# Patient Record
Sex: Female | Born: 1975 | Race: White | Hispanic: No | Marital: Married | Smoking: Never smoker
Health system: Southern US, Community
[De-identification: ages and names within clinical notes are randomized; demographics above are authoritative.]

## PROBLEM LIST (undated history)

## (undated) DIAGNOSIS — Z789 Other specified health status: Secondary | ICD-10-CM

## (undated) HISTORY — PX: WISDOM TOOTH EXTRACTION: SHX21

## (undated) HISTORY — DX: Other specified health status: Z78.9

---

## 2005-11-05 ENCOUNTER — Encounter: Admission: RE | Admit: 2005-11-05 | Discharge: 2005-11-05 | Payer: Self-pay | Admitting: Family Medicine

## 2005-12-12 ENCOUNTER — Ambulatory Visit: Payer: Self-pay | Admitting: Obstetrics & Gynecology

## 2005-12-15 ENCOUNTER — Ambulatory Visit: Payer: Self-pay | Admitting: Obstetrics & Gynecology

## 2006-07-28 ENCOUNTER — Observation Stay: Payer: Self-pay | Admitting: Unknown Physician Specialty

## 2006-08-03 ENCOUNTER — Inpatient Hospital Stay: Payer: Self-pay | Admitting: Obstetrics & Gynecology

## 2007-08-29 ENCOUNTER — Emergency Department (HOSPITAL_COMMUNITY): Admission: EM | Admit: 2007-08-29 | Discharge: 2007-08-29 | Payer: Self-pay | Admitting: Emergency Medicine

## 2008-04-17 ENCOUNTER — Ambulatory Visit: Payer: Self-pay | Admitting: Family Medicine

## 2009-05-12 ENCOUNTER — Emergency Department: Payer: Self-pay | Admitting: Emergency Medicine

## 2009-06-06 ENCOUNTER — Ambulatory Visit: Payer: Self-pay | Admitting: Obstetrics and Gynecology

## 2010-11-17 ENCOUNTER — Encounter: Payer: Self-pay | Admitting: Family Medicine

## 2014-12-29 LAB — HM PAP SMEAR: HM Pap smear: NORMAL

## 2018-08-09 ENCOUNTER — Encounter: Payer: Self-pay | Admitting: Obstetrics and Gynecology

## 2018-08-09 ENCOUNTER — Other Ambulatory Visit (HOSPITAL_COMMUNITY)
Admission: RE | Admit: 2018-08-09 | Discharge: 2018-08-09 | Disposition: A | Discharge status: Home / Self Care | Payer: BLUE CROSS/BLUE SHIELD | Source: Ambulatory Visit | Attending: Obstetrics and Gynecology | Admitting: Obstetrics and Gynecology

## 2018-08-09 ENCOUNTER — Ambulatory Visit (INDEPENDENT_AMBULATORY_CARE_PROVIDER_SITE_OTHER): Payer: BLUE CROSS/BLUE SHIELD | Admitting: Obstetrics and Gynecology

## 2018-08-09 VITALS — BP 118/78 | Ht 62.0 in | Wt 132.0 lb

## 2018-08-09 DIAGNOSIS — Z124 Encounter for screening for malignant neoplasm of cervix: Secondary | ICD-10-CM | POA: Insufficient documentation

## 2018-08-09 DIAGNOSIS — Z1339 Encounter for screening examination for other mental health and behavioral disorders: Secondary | ICD-10-CM

## 2018-08-09 DIAGNOSIS — Z01419 Encounter for gynecological examination (general) (routine) without abnormal findings: Secondary | ICD-10-CM

## 2018-08-09 DIAGNOSIS — Z1331 Encounter for screening for depression: Secondary | ICD-10-CM | POA: Diagnosis not present

## 2018-08-09 DIAGNOSIS — Z1151 Encounter for screening for human papillomavirus (HPV): Secondary | ICD-10-CM | POA: Diagnosis not present

## 2018-08-09 NOTE — Progress Notes (Signed)
Gynecology Annual Exam  PCP: Patient, No Pcp Per  Chief Complaint  Patient presents with  . Annual Exam   History of Present Illness:  Ms. KEYLA MILONE is a 42 y.o. [phone] who LMP was Patient's last menstrual period was 08/02/2018., presents today for her annual examination.  Her menses are regular every 28-30 days, lasting 6 day(s).  Dysmenorrhea mild, occurring first 1-2 days of flow. She does not have intermenstrual bleeding.  She is single partner, contraception - vasectomy.   Last Pap: 12/2014  Results were: no abnormalities / HPV DNA negative.  Hx of STDs: chlamydia - 20 years ago, treated.   Last mammogram: never had  There is no FH of breast cancer. There is no FH of ovarian cancer. The patient does do self-breast exams.  Colonoscopy: n/a DEXA: has not been screened for osteoporosis  Tobacco use: The patient denies current or previous tobacco use. Alcohol use: none Exercise: moderately active  The patient wears seatbelts: yes.     History reviewed. No pertinent past medical history.  Past Surgical History:  Procedure Laterality Date  . WISDOM TOOTH EXTRACTION      Prior to Admission medications   denies   Allergies:No Known Allergies  Obstetric History: [id number], SVD x 4  Family History  Problem Relation Age of Onset  . Hypertension Mother   . Diabetes Mellitus II Mother   . Hypertension Father   . Heart disease Father   . Diabetes Mellitus II Father   . Colon cancer Father     Social History   Socioeconomic History  . Marital status: Married    Spouse name: Not on file  . Number of children: Not on file  . Years of education: Not on file  . Highest education level: Not on file  Occupational History  . Not on file  Social Needs  . Financial resource strain: Not on file  . Food insecurity:    Worry: Not on file    Inability: Not on file  . Transportation needs:    Medical: Not on file    Non-medical: Not on file  Tobacco Use  . Smoking  status: Never Smoker  . Smokeless tobacco: Never Used  Substance and Sexual Activity  . Alcohol use: Never    Frequency: Never  . Drug use: Never  . Sexual activity: Yes    Birth control/protection: Surgical  Lifestyle  . Physical activity:    Days per week: Not on file    Minutes per session: Not on file  . Stress: Not on file  Relationships  . Social connections:    Talks on phone: Not on file    Gets together: Not on file    Attends religious service: Not on file    Active member of club or organization: Not on file    Attends meetings of clubs or organizations: Not on file    Relationship status: Not on file  . Intimate partner violence:    Fear of current or ex partner: Not on file    Emotionally abused: Not on file    Physically abused: Not on file    Forced sexual activity: Not on file  Other Topics Concern  . Not on file  Social History Narrative  . Not on file    Review of Systems  Constitutional: Negative.   HENT: Negative.   Eyes: Negative.   Respiratory: Negative.   Cardiovascular: Negative.   Gastrointestinal: Negative.   Genitourinary: Negative.   Musculoskeletal:  Negative.   Skin: Negative.   Neurological: Negative.   Psychiatric/Behavioral: Negative.      Physical Exam BP 118/78   Ht 5\' 2"  (1.575 m)   Wt 132 lb (59.9 kg)   LMP 08/02/2018   BMI 24.14 kg/m   Physical Exam  Constitutional: She is oriented to person, place, and time. She appears well-developed and well-nourished. No distress.  Genitourinary: Uterus normal. Pelvic exam was performed with patient supine. There is no rash, tenderness, lesion or injury on the right labia. There is no rash, tenderness, lesion or injury on the left labia. No erythema, tenderness or bleeding in the vagina. No signs of injury around the vagina. No vaginal discharge found. Right adnexum does not display mass, does not display tenderness and does not display fullness. Left adnexum does not display mass, does  not display tenderness and does not display fullness. Cervix does not exhibit motion tenderness, lesion, discharge or polyp.   Uterus is mobile and anteverted. Uterus is not enlarged, tender or exhibiting a mass.  HENT:  Head: Normocephalic and atraumatic.  Eyes: EOM are normal. No scleral icterus.  Neck: Normal range of motion. Neck supple. No thyromegaly present.  Cardiovascular: Normal rate and regular rhythm. Exam reveals no gallop and no friction rub.  No murmur heard. Pulmonary/Chest: Effort normal and breath sounds normal. No respiratory distress. She has no wheezes. She has no rales. Right breast exhibits no inverted nipple, no mass, no nipple discharge, no skin change and no tenderness. Left breast exhibits no inverted nipple, no mass, no nipple discharge, no skin change and no tenderness.  Abdominal: Soft. Bowel sounds are normal. She exhibits no distension and no mass. There is no tenderness. There is no rebound and no guarding.  Musculoskeletal: Normal range of motion. She exhibits no edema or tenderness.  Lymphadenopathy:    She has no cervical adenopathy.       Right: No inguinal adenopathy present.       Left: No inguinal adenopathy present.  Neurological: She is alert and oriented to person, place, and time. No cranial nerve deficit.  Skin: Skin is warm and dry. No rash noted. No erythema.  Psychiatric: She has a normal mood and affect. Her behavior is normal. Judgment normal.    Female chaperone present for pelvic and breast  portions of the physical exam  Results: AUDIT Questionnaire (screen for alcoholism): 0 PHQ-9: 2  Assessment: 42 y.o. [id number] female here for routine gynecologic examination.  Plan: Problem List Items Addressed This Visit    None    Visit Diagnoses    Women's annual routine gynecological examination    -  Primary   Relevant Orders   Cytology - PAP   Screening for depression       Screening for alcoholism       Pap smear for cervical cancer  screening       Relevant Orders   Cytology - PAP     Screening: -- Blood pressure screen normal -- Colonoscopy - not due -- Mammogram - due. Patient to call [facility] to arrange. She understands that it is her responsibility to arrange this. -- Weight screening: normal -- Depression screening negative (PHQ-9) -- Nutrition: normal -- cholesterol screening: not due for screening -- osteoporosis screening: not due -- tobacco screening: not using -- alcohol screening: AUDIT questionnaire indicates low-risk usage. -- family history of breast cancer screening: done. not at high risk. -- no evidence of domestic violence or intimate partner violence. -- STD screening:  gonorrhea/chlamydia NAAT not collected per patient request. -- pap smear collected per ASCCP guidelines -- flu vaccine declines -- HPV vaccination series: did not receive  Thomasene Mohair, MD 08/09/2018 12:03 PM

## 2018-08-11 LAB — CYTOLOGY - PAP
DIAGNOSIS: NEGATIVE
HPV: NOT DETECTED

## 2020-09-06 ENCOUNTER — Ambulatory Visit (INDEPENDENT_AMBULATORY_CARE_PROVIDER_SITE_OTHER): Payer: 59 | Admitting: Obstetrics and Gynecology

## 2020-09-06 ENCOUNTER — Encounter: Payer: Self-pay | Admitting: Obstetrics and Gynecology

## 2020-09-06 ENCOUNTER — Other Ambulatory Visit: Payer: Self-pay

## 2020-09-06 VITALS — BP 122/74 | Ht 64.0 in | Wt 132.0 lb

## 2020-09-06 DIAGNOSIS — Z1231 Encounter for screening mammogram for malignant neoplasm of breast: Secondary | ICD-10-CM | POA: Diagnosis not present

## 2020-09-06 DIAGNOSIS — Z01419 Encounter for gynecological examination (general) (routine) without abnormal findings: Secondary | ICD-10-CM | POA: Diagnosis not present

## 2020-09-06 DIAGNOSIS — Z1331 Encounter for screening for depression: Secondary | ICD-10-CM

## 2020-09-06 DIAGNOSIS — Z1339 Encounter for screening examination for other mental health and behavioral disorders: Secondary | ICD-10-CM | POA: Diagnosis not present

## 2020-09-06 NOTE — Progress Notes (Signed)
Gynecology Annual Exam  PCP: Patient, No Pcp Per  Chief Complaint  Patient presents with  . Annual Exam   History of Present Illness:  Ms. Kimberly Carrillo is a 44 y.o. [phone] who LMP was Patient's last menstrual period was 08/20/2020., presents today for her annual examination.  Her menses are regular every 28-30 days, lasting 7 day(s).  Dysmenorrhea none. She does not have intermenstrual bleeding.  She is sexually active. She has no issues with intercourse. She uses vasectomy for contraception.. She does not have vaginal dryness.  Last Pap: 07/2018  Results were: no abnormalities /neg HPV DNA neg.  Hx of STDs: distant treated   Last mammogram:  Never had There is no FH of breast cancer. There is no FH of ovarian cancer. The patient does not do self-breast exams.  Colonoscopy: never had.   DEXA: has not been screened for osteoporosis  Tobacco use: The patient denies current or previous tobacco use. Alcohol use: none Exercise: at work.   The patient wears seatbelts: yes.     Past Medical History:  Diagnosis Date  . No known health problems     Past Surgical History:  Procedure Laterality Date  . WISDOM TOOTH EXTRACTION      Prior to Admission medications   Denies   Allergies: No Known Allergies  Obstetric History: [id number]  Family History  Problem Relation Age of Onset  . Hypertension Mother   . Diabetes Mellitus II Mother   . Hypertension Father   . Heart disease Father   . Diabetes Mellitus II Father   . Colon cancer Father     Social History   Socioeconomic History  . Marital status: Married    Spouse name: Not on file  . Number of children: Not on file  . Years of education: Not on file  . Highest education level: Not on file  Occupational History  . Not on file  Tobacco Use  . Smoking status: Never Smoker  . Smokeless tobacco: Never Used  Vaping Use  . Vaping Use: Never used  Substance and Sexual Activity  . Alcohol use: Never  . Drug use:  Never  . Sexual activity: Yes    Birth control/protection: Surgical    Comment: vasectomy  Other Topics Concern  . Not on file  Social History Narrative  . Not on file   Social Determinants of Health   Financial Resource Strain:   . Difficulty of Paying Living Expenses: Not on file  Food Insecurity:   . Worried About Programme researcher, broadcasting/film/video in the Last Year: Not on file  . Ran Out of Food in the Last Year: Not on file  Transportation Needs:   . Lack of Transportation (Medical): Not on file  . Lack of Transportation (Non-Medical): Not on file  Physical Activity:   . Days of Exercise per Week: Not on file  . Minutes of Exercise per Session: Not on file  Stress:   . Feeling of Stress : Not on file  Social Connections:   . Frequency of Communication with Friends and Family: Not on file  . Frequency of Social Gatherings with Friends and Family: Not on file  . Attends Religious Services: Not on file  . Active Member of Clubs or Organizations: Not on file  . Attends Banker Meetings: Not on file  . Marital Status: Not on file  Intimate Partner Violence:   . Fear of Current or Ex-Partner: Not on file  . Emotionally Abused:  Not on file  . Physically Abused: Not on file  . Sexually Abused: Not on file    Review of Systems  Constitutional: Negative.   HENT: Negative.   Eyes: Negative.   Respiratory: Negative.   Cardiovascular: Negative.   Gastrointestinal: Negative.   Genitourinary: Negative.   Musculoskeletal: Negative.   Skin: Negative.   Neurological: Negative.   Psychiatric/Behavioral: Negative.      Physical Exam BP 122/74   Ht 5\' 4"  (1.626 m)   Wt 132 lb (59.9 kg)   LMP 08/20/2020   BMI 22.66 kg/m   Physical Exam Constitutional:      General: She is not in acute distress.    Appearance: Normal appearance. She is well-developed.  Genitourinary:     Pelvic exam was performed with patient in the lithotomy position.     Vulva, urethra, bladder and  uterus normal.     No inguinal adenopathy present in the right or left side.    No signs of injury in the vagina.     No vaginal discharge, erythema, tenderness or bleeding.     No cervical motion tenderness, discharge, lesion or polyp.     Uterus is mobile.     Uterus is not enlarged or tender.     No uterine mass detected.    Uterus is anteverted.     No right or left adnexal mass present.     Right adnexa not tender or full.     Left adnexa not tender or full.  HENT:     Head: Normocephalic and atraumatic.  Eyes:     General: No scleral icterus.    Conjunctiva/sclera: Conjunctivae normal.  Neck:     Thyroid: No thyromegaly.  Cardiovascular:     Rate and Rhythm: Normal rate and regular rhythm.     Heart sounds: No murmur heard.  No friction rub. No gallop.   Pulmonary:     Effort: Pulmonary effort is normal. No respiratory distress.     Breath sounds: Normal breath sounds. No wheezing or rales.  Chest:     Breasts:        Right: No inverted nipple, mass, nipple discharge, skin change or tenderness.        Left: No inverted nipple, mass, nipple discharge, skin change or tenderness.  Abdominal:     General: Bowel sounds are normal. There is no distension.     Palpations: Abdomen is soft. There is no mass.     Tenderness: There is no abdominal tenderness. There is no guarding or rebound.  Musculoskeletal:        General: No swelling or tenderness. Normal range of motion.     Cervical back: Normal range of motion and neck supple.  Lymphadenopathy:     Cervical: No cervical adenopathy.     Lower Body: No right inguinal adenopathy. No left inguinal adenopathy.  Neurological:     General: No focal deficit present.     Mental Status: She is alert and oriented to person, place, and time.     Cranial Nerves: No cranial nerve deficit.  Skin:    General: Skin is warm and dry.     Findings: No erythema or rash.  Psychiatric:        Mood and Affect: Mood normal.        Behavior:  Behavior normal.        Judgment: Judgment normal.     Female chaperone present for pelvic and breast  portions of the  physical exam  Results: AUDIT Questionnaire (screen for alcoholism): 0 PHQ-9: 0  Assessment: 44 y.o. [id number] female here for routine gynecologic examination.  Plan: Problem List Items Addressed This Visit    None    Visit Diagnoses    Women's annual routine gynecological examination    -  Primary   Relevant Orders   MM DIGITAL SCREENING BILATERAL   Screening for depression       Screening for alcoholism       Encounter for screening mammogram for malignant neoplasm of breast       Relevant Orders   MM DIGITAL SCREENING BILATERAL      Screening: -- Blood pressure screen normal -- Colonoscopy - not due but understands recommendations now for age 51. She has discussed with her PCP at [facility].  -- Mammogram - due. Patient to call [facility] to arrange. She understands that it is her responsibility to arrange this. -- Weight screening: normal -- Depression screening negative (PHQ-9) -- Nutrition: normal -- cholesterol screening: not due for screening -- osteoporosis screening: not due -- tobacco screening: not using -- alcohol screening: AUDIT questionnaire indicates low-risk usage. -- family history of breast cancer screening: done. not at high risk. -- no evidence of domestic violence or intimate partner violence. -- STD screening: gonorrhea/chlamydia NAAT not collected per patient request. -- pap smear not collected per ASCCP guidelines -- flu vaccine declines -- HPV vaccination series: not eligilbe  -- COVID19 vaccinations: has received  Thomasene Mohair, MD 09/06/2020 3:33 PM

## 2020-09-07 ENCOUNTER — Ambulatory Visit
Admission: RE | Admit: 2020-09-07 | Discharge: 2020-09-07 | Disposition: A | Discharge status: Home / Self Care | Payer: 59 | Source: Ambulatory Visit | Attending: Obstetrics and Gynecology | Admitting: Obstetrics and Gynecology

## 2020-09-07 ENCOUNTER — Encounter: Payer: Self-pay | Admitting: Radiology

## 2020-09-07 DIAGNOSIS — N63 Unspecified lump in unspecified breast: Secondary | ICD-10-CM | POA: Insufficient documentation

## 2020-09-07 DIAGNOSIS — Z01419 Encounter for gynecological examination (general) (routine) without abnormal findings: Secondary | ICD-10-CM

## 2020-09-07 DIAGNOSIS — Z1231 Encounter for screening mammogram for malignant neoplasm of breast: Secondary | ICD-10-CM | POA: Diagnosis present

## 2020-09-11 ENCOUNTER — Other Ambulatory Visit: Payer: Self-pay | Admitting: Obstetrics and Gynecology

## 2020-09-11 DIAGNOSIS — N6310 Unspecified lump in the right breast, unspecified quadrant: Secondary | ICD-10-CM

## 2020-09-11 DIAGNOSIS — R928 Other abnormal and inconclusive findings on diagnostic imaging of breast: Secondary | ICD-10-CM

## 2020-09-18 ENCOUNTER — Ambulatory Visit
Admission: RE | Admit: 2020-09-18 | Discharge: 2020-09-18 | Disposition: A | Discharge status: Home / Self Care | Payer: 59 | Source: Ambulatory Visit | Attending: Obstetrics and Gynecology | Admitting: Obstetrics and Gynecology

## 2020-09-18 ENCOUNTER — Other Ambulatory Visit: Payer: Self-pay

## 2020-09-18 DIAGNOSIS — N6310 Unspecified lump in the right breast, unspecified quadrant: Secondary | ICD-10-CM | POA: Diagnosis present

## 2020-09-18 DIAGNOSIS — R928 Other abnormal and inconclusive findings on diagnostic imaging of breast: Secondary | ICD-10-CM | POA: Insufficient documentation

## 2020-09-24 ENCOUNTER — Other Ambulatory Visit: Payer: Self-pay | Admitting: Obstetrics and Gynecology

## 2020-09-24 DIAGNOSIS — R928 Other abnormal and inconclusive findings on diagnostic imaging of breast: Secondary | ICD-10-CM

## 2020-09-24 DIAGNOSIS — N6310 Unspecified lump in the right breast, unspecified quadrant: Secondary | ICD-10-CM

## 2021-05-15 ENCOUNTER — Ambulatory Visit
Admission: RE | Admit: 2021-05-15 | Discharge: 2021-05-15 | Disposition: A | Discharge status: Home / Self Care | Payer: 59 | Source: Ambulatory Visit | Attending: Obstetrics and Gynecology | Admitting: Obstetrics and Gynecology

## 2021-05-15 ENCOUNTER — Other Ambulatory Visit: Payer: Self-pay

## 2021-05-15 DIAGNOSIS — N6310 Unspecified lump in the right breast, unspecified quadrant: Secondary | ICD-10-CM | POA: Insufficient documentation

## 2021-05-15 DIAGNOSIS — R928 Other abnormal and inconclusive findings on diagnostic imaging of breast: Secondary | ICD-10-CM | POA: Insufficient documentation

## 2021-06-02 ENCOUNTER — Other Ambulatory Visit: Payer: Self-pay | Admitting: Obstetrics and Gynecology

## 2021-06-02 DIAGNOSIS — R928 Other abnormal and inconclusive findings on diagnostic imaging of breast: Secondary | ICD-10-CM

## 2021-06-02 DIAGNOSIS — N6310 Unspecified lump in the right breast, unspecified quadrant: Secondary | ICD-10-CM

## 2021-11-13 ENCOUNTER — Telehealth: Payer: Self-pay

## 2021-11-13 NOTE — Telephone Encounter (Signed)
Pt calling; it's time for her 73m mammogram f/u; needs referral sent over.  [phone]

## 2021-11-13 NOTE — Telephone Encounter (Signed)
Once again I won't be able to even follow up on these results.  Please pass to someone who can actually contact her with the results once they come in

## 2021-11-15 ENCOUNTER — Other Ambulatory Visit: Payer: Self-pay | Admitting: Obstetrics and Gynecology

## 2021-11-15 DIAGNOSIS — R928 Other abnormal and inconclusive findings on diagnostic imaging of breast: Secondary | ICD-10-CM

## 2021-11-15 DIAGNOSIS — N6311 Unspecified lump in the right breast, upper outer quadrant: Secondary | ICD-10-CM

## 2021-11-15 NOTE — Telephone Encounter (Signed)
Orders placed. Pt can now call directly to schedule. Thx

## 2021-11-15 NOTE — Telephone Encounter (Signed)
Left detailed msg 'your order has been put in; you can now call directly and schedule your procedure.'

## 2021-12-06 ENCOUNTER — Other Ambulatory Visit: Payer: Self-pay

## 2021-12-06 ENCOUNTER — Ambulatory Visit
Admission: RE | Admit: 2021-12-06 | Discharge: 2021-12-06 | Disposition: A | Discharge status: Home / Self Care | Payer: 59 | Source: Ambulatory Visit | Attending: Obstetrics and Gynecology | Admitting: Obstetrics and Gynecology

## 2021-12-06 DIAGNOSIS — R928 Other abnormal and inconclusive findings on diagnostic imaging of breast: Secondary | ICD-10-CM | POA: Insufficient documentation

## 2021-12-06 DIAGNOSIS — N6311 Unspecified lump in the right breast, upper outer quadrant: Secondary | ICD-10-CM

## 2021-12-07 ENCOUNTER — Encounter: Payer: Self-pay | Admitting: Obstetrics and Gynecology

## 2022-09-10 ENCOUNTER — Other Ambulatory Visit (HOSPITAL_COMMUNITY)
Admission: RE | Admit: 2022-09-10 | Discharge: 2022-09-10 | Disposition: A | Discharge status: Home / Self Care | Payer: Commercial Managed Care - HMO | Source: Ambulatory Visit | Attending: Obstetrics and Gynecology | Admitting: Obstetrics and Gynecology

## 2022-09-10 ENCOUNTER — Encounter: Payer: Self-pay | Admitting: Obstetrics and Gynecology

## 2022-09-10 ENCOUNTER — Ambulatory Visit (INDEPENDENT_AMBULATORY_CARE_PROVIDER_SITE_OTHER): Payer: Commercial Managed Care - HMO | Admitting: Obstetrics and Gynecology

## 2022-09-10 VITALS — BP 102/62 | HR 59 | Resp 16 | Ht 63.0 in | Wt 114.5 lb

## 2022-09-10 DIAGNOSIS — R928 Other abnormal and inconclusive findings on diagnostic imaging of breast: Secondary | ICD-10-CM

## 2022-09-10 DIAGNOSIS — Z124 Encounter for screening for malignant neoplasm of cervix: Secondary | ICD-10-CM

## 2022-09-10 DIAGNOSIS — Z13220 Encounter for screening for lipoid disorders: Secondary | ICD-10-CM

## 2022-09-10 DIAGNOSIS — Z1211 Encounter for screening for malignant neoplasm of colon: Secondary | ICD-10-CM

## 2022-09-10 DIAGNOSIS — Z01419 Encounter for gynecological examination (general) (routine) without abnormal findings: Secondary | ICD-10-CM

## 2022-09-10 DIAGNOSIS — Z7689 Persons encountering health services in other specified circumstances: Secondary | ICD-10-CM

## 2022-09-10 DIAGNOSIS — Z131 Encounter for screening for diabetes mellitus: Secondary | ICD-10-CM

## 2022-09-10 NOTE — Progress Notes (Unsigned)
GYNECOLOGY ANNUAL PHYSICAL EXAM PROGRESS NOTE  Subjective:    Kimberly Carrillo is a 46 y.o. [phone] female who presents for an annual exam. The patient has no complaints today. The patient is sexually active. The patient participates in regular exercise: yes. Has the patient ever been transfused or tattooed?: no. The patient reports that there is not domestic violence in her life.    Menstrual History: Menarche age: *** Patient's last menstrual period was 08/31/2022 (exact date).     Gynecologic History:  Contraception: vasectomy History of STI's: Denies Last Pap: 08/09/2018. Results were: normal.  Denies h/o abnormal pap smears. Last mammogram: 12/06/2021. Results were: normal. IMPRESSION: 1. 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 2. No mammographic evidence of malignancy involving the LEFT breast.   RECOMMENDATION: 1. RIGHT breast ultrasound in November, 2023, in order to confirm 2 years of stability of the likely benign mass. 2. If the mass remains stable on that examination, the patient may return to annual screening mammography in 1 year.  Period Cycle (Days): 28 Period Duration (Days): 5-7 Period Pattern: Regular Menstrual Flow: Moderate Menstrual Control: Maxi pad Menstrual Control Change Freq (Hours): 2-3 Dysmenorrhea: None     OB History  Gravida Para Term Preterm AB Living  4 4 4  0 0 4  SAB IAB Ectopic Multiple Live Births  0 0 0 0 4    # Outcome Date GA Lbr Len/2nd Weight Sex Delivery Anes PTL Lv  4 Term           3 Term           2 Term           1 Term             Past Medical History:  Diagnosis Date   No known health problems     Past Surgical History:  Procedure Laterality Date   WISDOM TOOTH EXTRACTION      Family History  Problem Relation Age of Onset   Hypertension Mother    Diabetes Mellitus II Mother    Hypertension Father    Heart disease  Father    Diabetes Mellitus II Father    Colon cancer Father     Social History   Socioeconomic History   Marital status: Married    Spouse name: Not on file   Number of children: Not on file   Years of education: Not on file   Highest education level: Not on file  Occupational History   Not on file  Tobacco Use   Smoking status: Never   Smokeless tobacco: Never  Vaping Use   Vaping Use: Never used  Substance and Sexual Activity   Alcohol use: Never   Drug use: Never   Sexual activity: Yes    Birth control/protection: Surgical    Comment: vasectomy  Other Topics Concern   Not on file  Social History Narrative   Not on file   Social Determinants of Health   Financial Resource Strain: Not on file  Food Insecurity: Not on file  Transportation Needs: Not on file  Physical Activity: Not on file  Stress: Not on file  Social Connections: Not on file  Intimate Partner Violence: Not on file    No current outpatient medications on file prior to visit.   No current facility-administered medications on file prior to visit.    No Known Allergies  Review of Systems Constitutional: negative for chills, fatigue, fevers and sweats Eyes: negative for irritation, redness and visual disturbance Ears, nose, mouth, throat, and face: negative for hearing loss, nasal congestion, snoring and tinnitus Respiratory: negative for asthma, cough, sputum Cardiovascular: negative for chest pain, dyspnea, exertional chest pressure/discomfort, irregular heart beat, palpitations and syncope Gastrointestinal: negative for abdominal pain, change in bowel habits, nausea and vomiting Genitourinary: negative for abnormal menstrual periods, genital lesions, sexual problems and vaginal discharge, dysuria and urinary incontinence Integument/breast: negative for breast lump, breast tenderness and nipple discharge Hematologic/lymphatic: negative for bleeding and easy bruising Musculoskeletal:negative  for back pain and muscle weakness Neurological: negative for dizziness, headaches, vertigo and weakness Endocrine: negative for diabetic symptoms including polydipsia, polyuria and skin dryness Allergic/Immunologic: negative for hay fever and urticaria      Objective:  Resp. rate 16, height 5\' 3"  (1.6 m), weight 114 lb 8 oz (51.9 kg), last menstrual period 08/31/2022. Body mass index is 20.28 kg/m.    General Appearance:    Alert, cooperative, no distress, appears stated age  Head:    Normocephalic, without obvious abnormality, atraumatic  Eyes:    PERRL, conjunctiva/corneas clear, EOM's intact, both eyes  Ears:    Normal external ear canals, both ears  Nose:   Nares normal, septum midline, mucosa normal, no drainage or sinus tenderness  Throat:   Lips, mucosa, and tongue normal; teeth and gums normal  Neck:   Supple, symmetrical, trachea midline, no adenopathy; thyroid: no enlargement/tenderness/nodules; no carotid bruit or JVD  Back:     Symmetric, no curvature, ROM normal, no CVA tenderness  Lungs:     Clear to auscultation bilaterally, respirations unlabored  Chest Wall:    No tenderness or deformity   Heart:    Regular rate and rhythm, S1 and S2 normal, no murmur, rub or gallop  Breast Exam:    No tenderness, masses, or nipple abnormality  Abdomen:     Soft, non-tender, bowel sounds active all four quadrants, no masses, no organomegaly.    Genitalia:    Pelvic:external genitalia normal, vagina without lesions, discharge, or tenderness, rectovaginal septum  normal. Cervix normal in appearance, no cervical motion tenderness, no adnexal masses or tenderness.  Uterus normal size, shape, mobile, regular contours, nontender.  Rectal:    Normal external sphincter.  No hemorrhoids appreciated. Internal exam not done.   Extremities:   Extremities normal, atraumatic, no cyanosis or edema  Pulses:   2+ and symmetric all extremities  Skin:   Skin color, texture, turgor normal, no rashes or  lesions  Lymph nodes:   Cervical, supraclavicular, and axillary nodes normal  Neurologic:   CNII-XII intact, normal strength, sensation and reflexes throughout   .  Labs:  No results found for: "WBC", "HGB", "HCT", "MCV", "PLT"  No results found for: "CREATININE", "BUN", "NA", "K", "CL", "CO2"  No results found for: "ALT", "AST", "GGT", "ALKPHOS", "BILITOT"  No results found for: "TSH"   Assessment:   No diagnosis found.   Plan:  Blood tests: Ordered. Breast self exam technique reviewed and patient encouraged to perform self-exam monthly. Contraception: vasectomy. Discussed healthy lifestyle modifications. Mammogram ordered Pap smear ordered. COVID vaccination status: Flu Vaccine: Declined Follow up in 1 year for annual exam   13/02/2022, MD [facility]

## 2022-09-10 NOTE — Patient Instructions (Signed)

## 2022-09-11 LAB — COMPREHENSIVE METABOLIC PANEL
ALT: 17 IU/L (ref 0–32)
AST: 20 IU/L (ref 0–40)
Albumin/Globulin Ratio: 2.1 (ref 1.2–2.2)
Albumin: 4.4 g/dL (ref 3.9–4.9)
Alkaline Phosphatase: 102 IU/L (ref 44–121)
BUN/Creatinine Ratio: 12 (ref 9–23)
BUN: 10 mg/dL (ref 6–24)
Bilirubin Total: 0.3 mg/dL (ref 0.0–1.2)
CO2: 24 mmol/L (ref 20–29)
Calcium: 9 mg/dL (ref 8.7–10.2)
Chloride: 103 mmol/L (ref 96–106)
Creatinine, Ser: 0.85 mg/dL (ref 0.57–1.00)
Globulin, Total: 2.1 g/dL (ref 1.5–4.5)
Glucose: 84 mg/dL (ref 70–99)
Potassium: 4.3 mmol/L (ref 3.5–5.2)
Sodium: 139 mmol/L (ref 134–144)
Total Protein: 6.5 g/dL (ref 6.0–8.5)
eGFR: 86 mL/min/{1.73_m2} (ref >59)

## 2022-09-11 LAB — TSH: TSH: 1.46 u[IU]/mL (ref 0.450–4.500)

## 2022-09-11 LAB — LIPID PANEL
Chol/HDL Ratio: 2.4 ratio (ref 0.0–4.4)
Cholesterol, Total: 150 mg/dL (ref 100–199)
HDL: 62 mg/dL (ref >39)
LDL Chol Calc (NIH): 67 mg/dL (ref 0–99)
Triglycerides: 118 mg/dL (ref 0–149)
VLDL Cholesterol Cal: 21 mg/dL (ref 5–40)

## 2022-09-11 LAB — CBC
Hematocrit: 39.8 % (ref 34.0–46.6)
Hemoglobin: 13.5 g/dL (ref 11.1–15.9)
MCH: 29.3 pg (ref 26.6–33.0)
MCHC: 33.9 g/dL (ref 31.5–35.7)
MCV: 86 fL (ref 79–97)
Platelets: 253 10*3/uL (ref 150–450)
RBC: 4.61 x10E6/uL (ref 3.77–5.28)
RDW: 11.9 % (ref 11.7–15.4)
WBC: 7.4 10*3/uL (ref 3.4–10.8)

## 2022-09-11 LAB — HEMOGLOBIN A1C
Est. average glucose Bld gHb Est-mCnc: 108 mg/dL
Hgb A1c MFr Bld: 5.4 % (ref 4.8–5.6)

## 2022-09-15 ENCOUNTER — Telehealth: Payer: Self-pay

## 2022-09-15 ENCOUNTER — Other Ambulatory Visit: Payer: Self-pay

## 2022-09-15 DIAGNOSIS — Z1211 Encounter for screening for malignant neoplasm of colon: Secondary | ICD-10-CM

## 2022-09-15 LAB — CYTOLOGY - PAP
Comment: NEGATIVE
Diagnosis: NEGATIVE
High risk HPV: NEGATIVE

## 2022-09-15 MED ORDER — NA SULFATE-K SULFATE-MG SULF 17.5-3.13-1.6 GM/177ML PO SOLN: 1.0000 | Freq: Once | ORAL | 0 refills | Status: AC

## 2022-09-15 NOTE — Telephone Encounter (Signed)
Gastroenterology Pre-Procedure Review  Request Date: 09/26/22 Requesting Physician: Dr. Allegra Lai  PATIENT REVIEW QUESTIONS: The patient responded to the following health history questions as indicated:    1. Are you having any GI issues? no 2. Do you have a personal history of Polyps? no 3. Do you have a family history of Colon Cancer or Polyps? yes (father cancerous polyp) 4. Diabetes Mellitus? no 5. Joint replacements in the past 12 months?no 6. Major health problems in the past 3 months?no 7. Any artificial heart valves, MVP, or defibrillator?no    MEDICATIONS & ALLERGIES:    Patient reports the following regarding taking any anticoagulation/antiplatelet therapy:   Plavix, Coumadin, Eliquis, Xarelto, Lovenox, Pradaxa, Brilinta, or Effient? no Aspirin? no  Patient confirms/reports the following medications:  No current outpatient medications on file.   No current facility-administered medications for this visit.    Patient confirms/reports the following allergies:  No Known Allergies  No orders of the defined types were placed in this encounter.   AUTHORIZATION INFORMATION Primary Insurance: 1D#: Group #:  Secondary Insurance: 1D#: Group #:  SCHEDULE INFORMATION: Date: 09/26/22 Time: Location: [facility]

## 2022-09-24 ENCOUNTER — Ambulatory Visit
Admission: RE | Admit: 2022-09-24 | Discharge: 2022-09-24 | Disposition: A | Discharge status: Home / Self Care | Payer: Commercial Managed Care - HMO | Source: Ambulatory Visit | Attending: Obstetrics and Gynecology | Admitting: Obstetrics and Gynecology

## 2022-09-24 DIAGNOSIS — R928 Other abnormal and inconclusive findings on diagnostic imaging of breast: Secondary | ICD-10-CM | POA: Insufficient documentation

## 2022-09-24 DIAGNOSIS — Z01419 Encounter for gynecological examination (general) (routine) without abnormal findings: Secondary | ICD-10-CM | POA: Insufficient documentation

## 2022-09-26 ENCOUNTER — Ambulatory Visit
Admission: RE | Admit: 2022-09-26 | Discharge: 2022-09-26 | Disposition: A | Discharge status: Home / Self Care | Payer: Commercial Managed Care - HMO | Attending: Gastroenterology | Admitting: Gastroenterology

## 2022-09-26 ENCOUNTER — Ambulatory Visit: Payer: Commercial Managed Care - HMO | Admitting: Anesthesiology

## 2022-09-26 ENCOUNTER — Encounter: Payer: Self-pay | Admitting: Gastroenterology

## 2022-09-26 ENCOUNTER — Other Ambulatory Visit: Payer: Self-pay

## 2022-09-26 ENCOUNTER — Encounter
Admission: RE | Disposition: A | Discharge status: Home / Self Care | Payer: Self-pay | Source: Home / Self Care | Attending: Gastroenterology

## 2022-09-26 DIAGNOSIS — Z1211 Encounter for screening for malignant neoplasm of colon: Secondary | ICD-10-CM | POA: Diagnosis present

## 2022-09-26 HISTORY — PX: COLONOSCOPY WITH PROPOFOL: SHX5780

## 2022-09-26 LAB — POCT PREGNANCY, URINE: Preg Test, Ur: NEGATIVE

## 2022-09-26 SURGERY — COLONOSCOPY WITH PROPOFOL
Anesthesia: General

## 2022-09-26 MED ORDER — PROPOFOL 10 MG/ML IV BOLUS
INTRAVENOUS | Status: AC
  Filled 2022-09-26: qty 40

## 2022-09-26 MED ORDER — PROPOFOL 10 MG/ML IV BOLUS
INTRAVENOUS | Status: DC | PRN
  Administered 2022-09-26: 80 mg via INTRAVENOUS
  Administered 2022-09-26: 30 mg via INTRAVENOUS

## 2022-09-26 MED ORDER — LIDOCAINE HCL (CARDIAC) PF 100 MG/5ML IV SOSY
PREFILLED_SYRINGE | INTRAVENOUS | Status: DC | PRN
  Administered 2022-09-26: 20 mg via INTRAVENOUS

## 2022-09-26 MED ORDER — SODIUM CHLORIDE 0.9 % IV SOLN: INTRAVENOUS | Status: DC

## 2022-09-26 MED ORDER — LIDOCAINE HCL (PF) 2 % IJ SOLN
INTRAMUSCULAR | Status: AC
  Filled 2022-09-26: qty 5

## 2022-09-26 MED ORDER — PROPOFOL 500 MG/50ML IV EMUL
INTRAVENOUS | Status: DC | PRN
  Administered 2022-09-26: 110 ug/kg/min via INTRAVENOUS

## 2022-09-26 NOTE — Anesthesia Preprocedure Evaluation (Signed)
Anesthesia Evaluation  Patient identified by MRN, date of birth, ID band Patient awake    Reviewed: Allergy & Precautions, NPO status , Patient's Chart, lab work & pertinent test results  Airway Mallampati: III  TM Distance: >3 FB Neck ROM: full    Dental  (+) Chipped   Pulmonary neg pulmonary ROS, neg shortness of breath   Pulmonary exam normal        Cardiovascular (-) angina negative cardio ROS Normal cardiovascular exam     Neuro/Psych negative neurological ROS  negative psych ROS   GI/Hepatic negative GI ROS, Neg liver ROS,neg GERD  ,,  Endo/Other  negative endocrine ROS    Renal/GU negative Renal ROS  negative genitourinary   Musculoskeletal   Abdominal   Peds  Hematology negative hematology ROS (+)   Anesthesia Other Findings Past Medical History: No date: No known health problems  Past Surgical History: No date: WISDOM TOOTH EXTRACTION  BMI    Body Mass Index: 19.49 kg/m      Reproductive/Obstetrics negative OB ROS                             Anesthesia Physical Anesthesia Plan  ASA: 1  Anesthesia Plan: General   Post-op Pain Management:    Induction: Intravenous  PONV Risk Score and Plan: Propofol infusion and TIVA  Airway Management Planned: Natural Airway and Nasal Cannula  Additional Equipment:   Intra-op Plan:   Post-operative Plan:   Informed Consent: I have reviewed the patients History and Physical, chart, labs and discussed the procedure including the risks, benefits and alternatives for the proposed anesthesia with the patient or authorized representative who has indicated his/her understanding and acceptance.     Dental Advisory Given  Plan Discussed with: Anesthesiologist, CRNA and Surgeon  Anesthesia Plan Comments: (Patient consented for risks of anesthesia including but not limited to:  - adverse reactions to medications - risk of airway  placement if required - damage to eyes, teeth, lips or other oral mucosa - nerve damage due to positioning  - sore throat or hoarseness - Damage to heart, brain, nerves, lungs, other parts of body or loss of life  Patient voiced understanding.)       Anesthesia Quick Evaluation

## 2022-09-26 NOTE — Transfer of Care (Signed)
Immediate Anesthesia Transfer of Care Note  Patient: Kimberly Carrillo  Procedure(s) Performed: COLONOSCOPY WITH PROPOFOL  Patient Location: PACU and Endoscopy Unit  Anesthesia Type:General  Level of Consciousness: drowsy  Airway & Oxygen Therapy: Patient Spontanous Breathing  Post-op Assessment: Report given to RN and Post -op Vital signs reviewed and stable  Post vital signs: Reviewed and stable  Last Vitals:  Vitals Value Taken Time  BP 98/53   Temp 97.7   Pulse 69 09/26/22 0949  Resp 17 09/26/22 0949  SpO2 100 % 09/26/22 0949  Vitals shown include unvalidated device data.  Last Pain:  Vitals:   09/26/22 0904  TempSrc: Temporal  PainSc: 0-No pain         Complications: No notable events documented.

## 2022-09-26 NOTE — H&P (Signed)
  Arlyss Repress, MD [address]  [address]  [place], [place] [address]  Main: [phone]  Fax: [phone] Pager: [phone]  Primary Care Physician:  Dorothey Baseman, MD Primary Gastroenterologist:  Dr. Arlyss Repress  Pre-Procedure History & Physical: HPI:  Kimberly Carrillo is a 46 y.o. female is here for an colonoscopy.   Past Medical History:  Diagnosis Date   No known health problems     Past Surgical History:  Procedure Laterality Date   WISDOM TOOTH EXTRACTION      Prior to Admission medications   Not on File    Allergies as of 09/15/2022   (No Known Allergies)    Family History  Problem Relation Age of Onset   Hypertension Mother    Diabetes Mellitus II Mother    Hypertension Father    Heart disease Father    Diabetes Mellitus II Father    Colon cancer Father     Social History   Socioeconomic History   Marital status: Married    Spouse name: Not on file   Number of children: Not on file   Years of education: Not on file   Highest education level: Not on file  Occupational History   Not on file  Tobacco Use   Smoking status: Never   Smokeless tobacco: Never  Vaping Use   Vaping Use: Never used  Substance and Sexual Activity   Alcohol use: Never   Drug use: Never   Sexual activity: Yes    Birth control/protection: Surgical    Comment: vasectomy  Other Topics Concern   Not on file  Social History Narrative   Not on file   Social Determinants of Health   Financial Resource Strain: Not on file  Food Insecurity: Not on file  Transportation Needs: Not on file  Physical Activity: Not on file  Stress: Not on file  Social Connections: Not on file  Intimate Partner Violence: Not on file    Review of Systems: See HPI, otherwise negative ROS  Physical Exam: BP 102/78   Pulse 78   Temp (!) 96.4 F (35.8 C) (Temporal)   Resp 18   Ht 5\' 3"  (1.6 m)   Wt 49.9 kg   LMP 08/31/2022 (Exact Date) Comment: Negative urine pregnancy  09-26-22 (LG)  SpO2 100%   BMI 19.49 kg/m  General:   Alert,  pleasant and cooperative in NAD Head:  Normocephalic and atraumatic. Neck:  Supple; no masses or thyromegaly. Lungs:  Clear throughout to auscultation.    Heart:  Regular rate and rhythm. Abdomen:  Soft, nontender and nondistended. Normal bowel sounds, without guarding, and without rebound.   Neurologic:  Alert and  oriented x4;  grossly normal neurologically.  Impression/Plan: Kimberly Carrillo is here for an colonoscopy to be performed for colon cancer screening  Risks, benefits, limitations, and alternatives regarding  colonoscopy have been reviewed with the patient.  Questions have been answered.  All parties agreeable.   Felipe Drone, MD  09/26/2022, 9:20 AM

## 2022-09-26 NOTE — Op Note (Signed)
[facility] Gastroenterology Patient Name: Kimberly Carrillo Procedure Date: 09/26/2022 9:26 AM MRN: [id number] Account #: [id number] Date of Birth: 11/04/1975 Admit Type: Outpatient Age: 46 Room: [facility] ENDO ROOM 2 Gender: Female Note Status: Finalized Instrument Name: Prentice Docker [id number] Procedure:             Colonoscopy Indications:           Screening for colorectal malignant neoplasm, This is                         the patient's first colonoscopy Providers:             Toney Reil MD, MD Referring MD:          Toney Reil MD, MD (Referring MD), Teena Irani.                         Terance Hart, MD (Referring MD) Medicines:             General Anesthesia Complications:         No immediate complications. Estimated blood loss: None. Procedure:             Pre-Anesthesia Assessment:                        - Prior to the procedure, a History and Physical was                         performed, and patient medications and allergies were                         reviewed. The patient is competent. The risks and                         benefits of the procedure and the sedation options and                         risks were discussed with the patient. All questions                         were answered and informed consent was obtained.                         Patient identification and proposed procedure were                         verified by the physician, the nurse, the                         anesthesiologist, the anesthetist and the technician                         in the pre-procedure area in the procedure room in the                         endoscopy suite. Mental Status Examination: alert and                         oriented. Airway Examination: normal oropharyngeal  airway and neck mobility. Respiratory Examination:                         clear to auscultation. CV Examination: normal.                         Prophylactic  Antibiotics: The patient does not require                         prophylactic antibiotics. Prior Anticoagulants: The                         patient has taken no anticoagulant or antiplatelet                         agents. ASA Grade Assessment: I - A normal, healthy                         patient. After reviewing the risks and benefits, the                         patient was deemed in satisfactory condition to                         undergo the procedure. The anesthesia plan was to use                         general anesthesia. Immediately prior to                         administration of medications, the patient was                         re-assessed for adequacy to receive sedatives. The                         heart rate, respiratory rate, oxygen saturations,                         blood pressure, adequacy of pulmonary ventilation, and                         response to care were monitored throughout the                         procedure. The physical status of the patient was                         re-assessed after the procedure.                        After obtaining informed consent, the colonoscope was                         passed under direct vision. Throughout the procedure,                         the patient's blood pressure, pulse, and oxygen  saturations were monitored continuously. The                         Colonoscope was introduced through the anus and                         advanced to the the cecum, identified by appendiceal                         orifice and ileocecal valve. The colonoscopy was                         performed without difficulty. The patient tolerated                         the procedure well. The quality of the bowel                         preparation was evaluated using the BBPS [facility]([place] Bowel                         Preparation Scale) with scores of: Right Colon = 3,                         Transverse Colon =  3 and Left Colon = 3 (entire mucosa                         seen well with no residual staining, small fragments                         of stool or opaque liquid). The total BBPS score                         equals 9. The ileocecal valve, appendiceal orifice,                         and rectum were photographed. Findings:      The perianal and digital rectal examinations were normal. Pertinent       negatives include normal sphincter tone and no palpable rectal lesions.      The entire examined colon appeared normal.      The retroflexed view of the distal rectum and anal verge was normal and       showed no anal or rectal abnormalities. Impression:            - The entire examined colon is normal.                        - The distal rectum and anal verge are normal on                         retroflexion view.                        - No specimens collected. Recommendation:        - Discharge patient to home (with escort).                        -  Resume previous diet today.                        - Continue present medications.                        - Repeat colonoscopy in 10 years for screening                         purposes. Procedure Code(s):     --- Professional ---                        Z6629, Colorectal cancer screening; colonoscopy on                         individual not meeting criteria for high risk Diagnosis Code(s):     --- Professional ---                        Z12.11, Encounter for screening for malignant neoplasm                         of colon CPT copyright 2022 American Medical Association. All rights reserved. The codes documented in this report are preliminary and upon coder review may  be revised to meet current compliance requirements. Dr. Libby Maw Toney Reil MD, MD 09/26/2022 9:46:50 AM This report has been signed electronically. Number of Addenda: 0 Note Initiated On: 09/26/2022 9:26 AM Scope Withdrawal Time: 0 hours 7 minutes 24 seconds   Total Procedure Duration: 0 hours 11 minutes 31 seconds  Estimated Blood Loss:  Estimated blood loss: none.      [facility]

## 2022-09-26 NOTE — Anesthesia Postprocedure Evaluation (Signed)
Anesthesia Post Note  Patient: Kimberly Carrillo  Procedure(s) Performed: COLONOSCOPY WITH PROPOFOL  Patient location during evaluation: Endoscopy Anesthesia Type: General Level of consciousness: awake and alert Pain management: pain level controlled Vital Signs Assessment: post-procedure vital signs reviewed and stable Respiratory status: spontaneous breathing, nonlabored ventilation, respiratory function stable and patient connected to nasal cannula oxygen Cardiovascular status: blood pressure returned to baseline and stable Postop Assessment: no apparent nausea or vomiting Anesthetic complications: no   No notable events documented.   Last Vitals:  Vitals:   09/26/22 0954 09/26/22 1000  BP: 100/70 100/70  Pulse: 74 68  Resp:  16  Temp:    SpO2: 100% 100%    Last Pain:  Vitals:   09/26/22 1000  TempSrc:   PainSc: 0-No pain                 Cleda Mccreedy Darian Ace

## 2023-01-30 ENCOUNTER — Other Ambulatory Visit: Payer: Self-pay | Admitting: Obstetrics and Gynecology

## 2023-01-30 DIAGNOSIS — Z1231 Encounter for screening mammogram for malignant neoplasm of breast: Secondary | ICD-10-CM

## 2023-12-28 IMAGING — US US BREAST*R* LIMITED INC AXILLA
1 series · 10 of 10 positions shown · non-contrast
Comparison: Previous exams.

CLINICAL DATA: 1+ year interval follow-up of likely benign masses
involving the RIGHT breast. Annual evaluation, LEFT breast.

EXAM:
DIGITAL DIAGNOSTIC BILATERAL MAMMOGRAM WITH TOMOSYNTHESIS AND CAD;
ULTRASOUND RIGHT BREAST LIMITED
TECHNIQUE: Bilateral digital diagnostic mammography and breast tomosynthesis
was performed. The images were evaluated with computer-aided
detection.; Targeted ultrasound examination of the right breast was
performed.

[Series 1: us breast*right* limited inc axilla · 0.06mm/px · 10 of 10 slices shown]
[im 1/10]
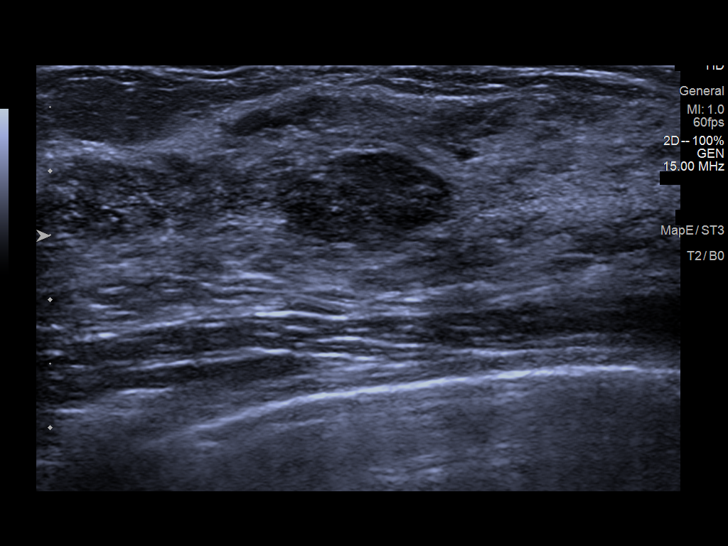
[im 2/10]
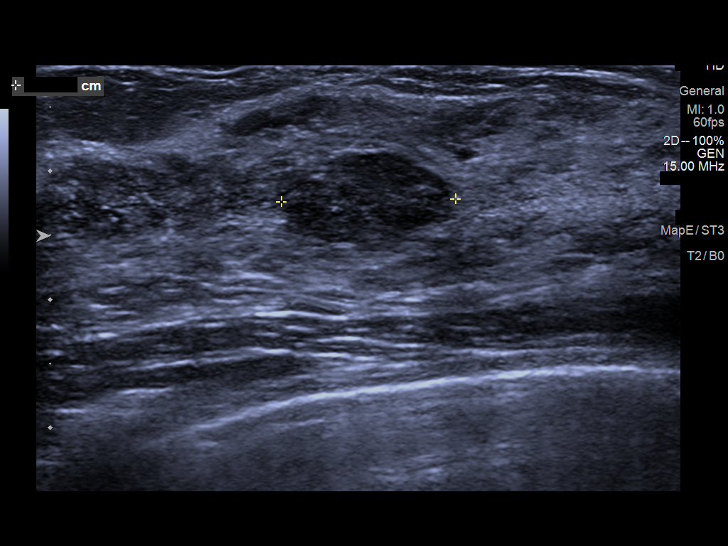
[im 3/10]
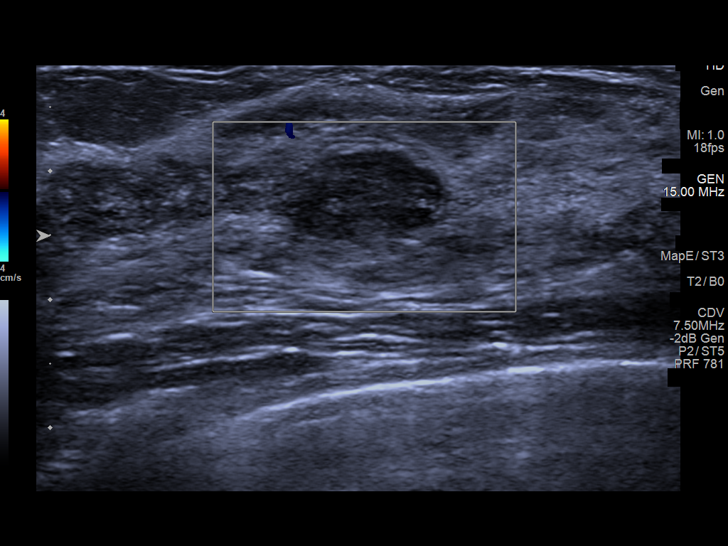
[im 4/10]
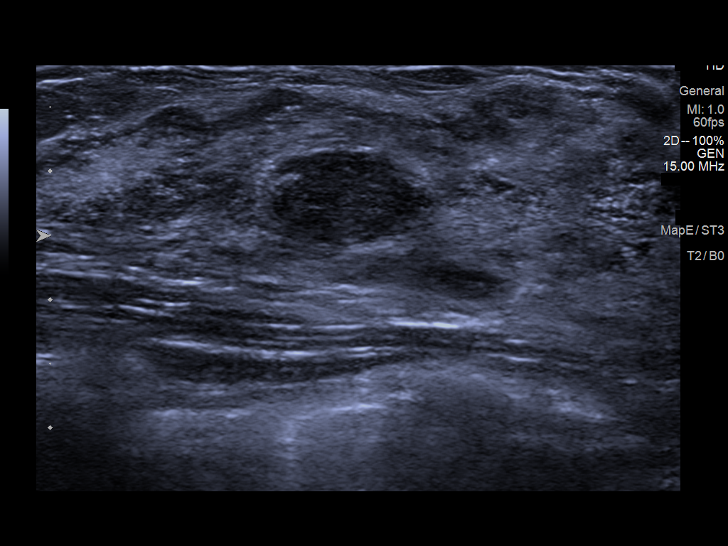
[im 5/10]
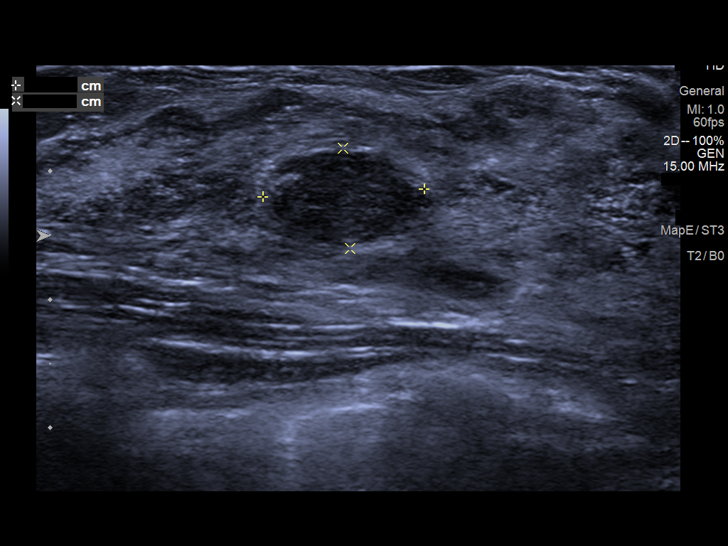
[im 6/10]
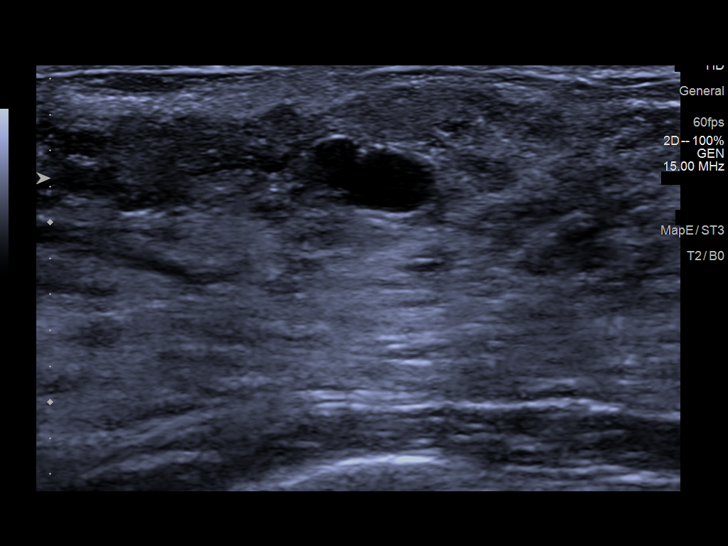
[im 7/10]
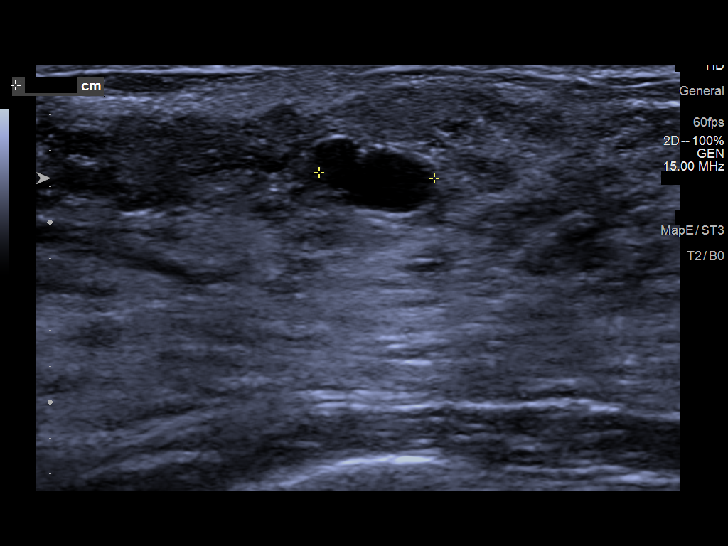
[im 8/10]
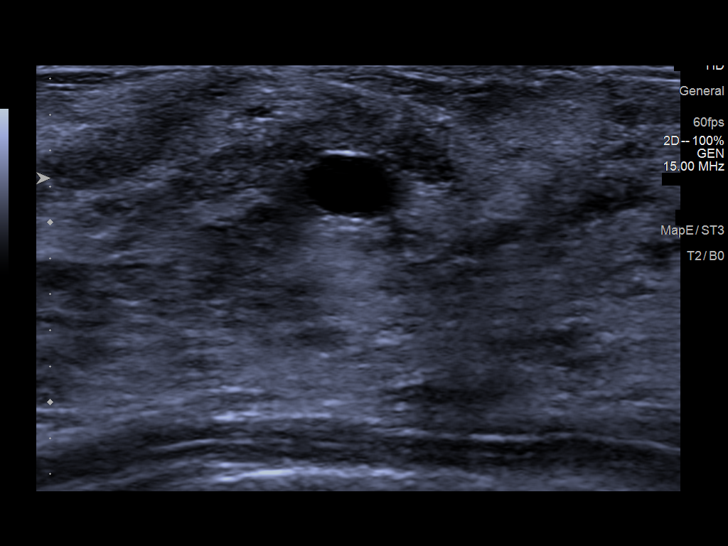
[im 9/10]
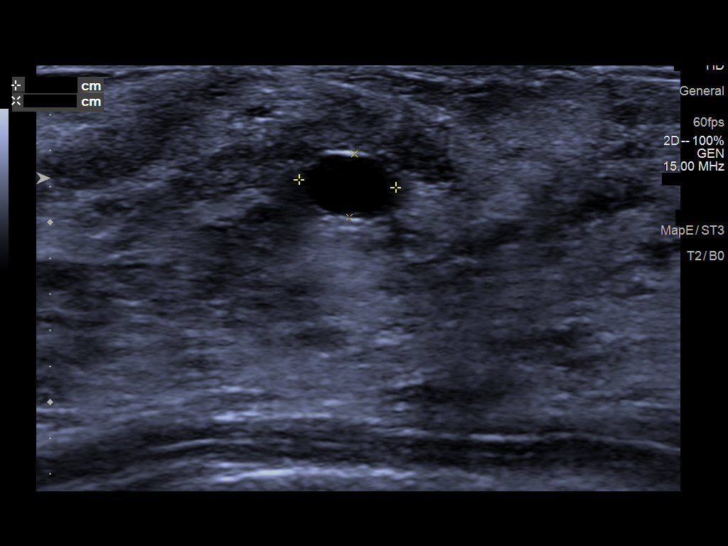
[im 10/10]
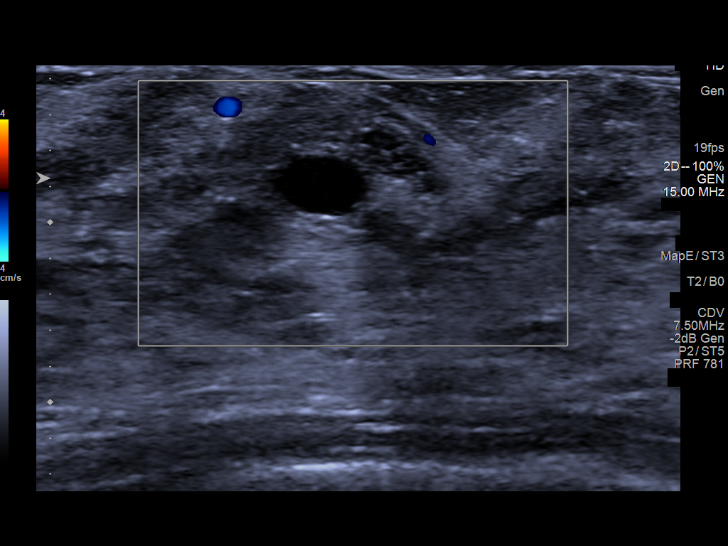

[10 of 10 positions shown; findings below may reference images not displayed]

ACR Breast Density Category d: The breast tissue is extremely dense,
which lowers the sensitivity of mammography.
FINDINGS: Full field CC and MLO views of both breasts were obtained.

RIGHT: The previously identified masses in the upper breast are
inconspicuous, obscured by the patient's dense fibroglandular
tissue. No new or suspicious findings elsewhere.

Targeted ultrasound is performed, again demonstrating the oval
circumscribed parallel hypoechoic mass at the 12 o'clock position 2
cm from the nipple measuring approximately 1.4 x 0.8 x 1.3 cm
(previously 1.5 x 0.3 x 1.4 cm on 05/15/2021 and 1.5 x 0.7 x 1.4 cm
on 09/18/2020), demonstrating mixed posterior characteristics and no
internal color Doppler flow.

The adjacent benign cyst at the 11:30 o'clock position 2 cm from the
nipple measures approximately 0.6 x 0.4 x 0.5 cm.

LEFT: No findings suspicious for malignancy.
IMPRESSION: 1. Stable likely benign 1.4 cm mass involving the upper RIGHT breast
at 12 o'clock 2 cm from the nipple, likely a fibroadenoma.
2. No mammographic evidence of malignancy involving the LEFT breast.

RECOMMENDATION:
1. RIGHT breast ultrasound in August 2022, in order to confirm 2
years of stability of the likely benign mass.
2. If the mass remains stable on that examination, the patient may
return to annual screening mammography in 1 year.

I have discussed the findings and recommendations with the patient.
If applicable, a reminder letter will be sent to the patient
regarding the next appointment.

BI-RADS CATEGORY  3: Probably benign.

## 2023-12-28 IMAGING — MG DIGITAL DIAGNOSTIC BILAT W/ TOMO W/ CAD
8 series · 8 of 24 positions shown · non-contrast
Comparison: Previous exams.

CLINICAL DATA: 1+ year interval follow-up of likely benign masses
involving the RIGHT breast. Annual evaluation, LEFT breast.

EXAM:
DIGITAL DIAGNOSTIC BILATERAL MAMMOGRAM WITH TOMOSYNTHESIS AND CAD;
ULTRASOUND RIGHT BREAST LIMITED
TECHNIQUE: Bilateral digital diagnostic mammography and breast tomosynthesis
was performed. The images were evaluated with computer-aided
detection.; Targeted ultrasound examination of the right breast was
performed.

[L MLO synth-2D]
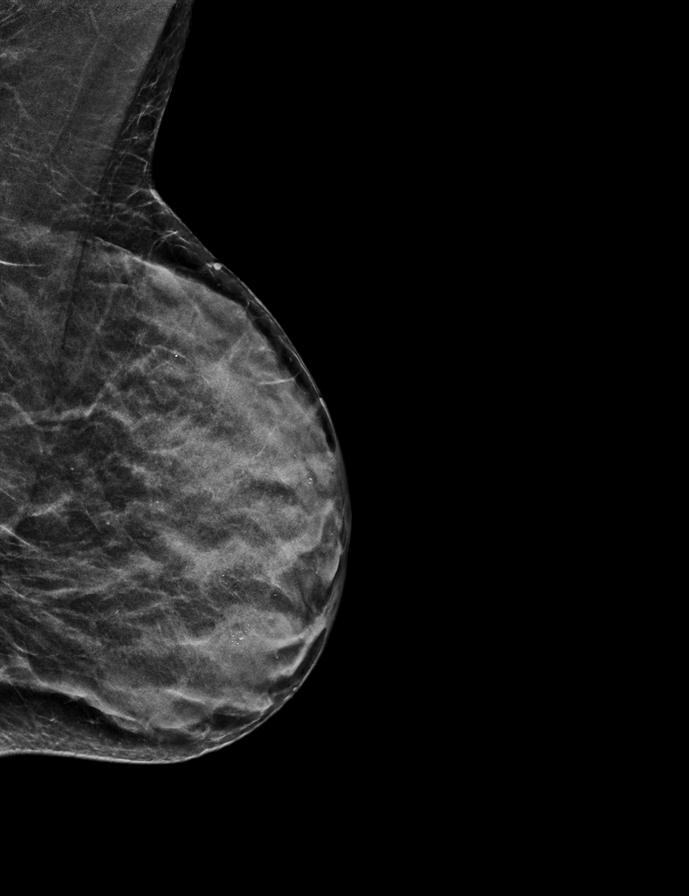

[L CC synth-2D]
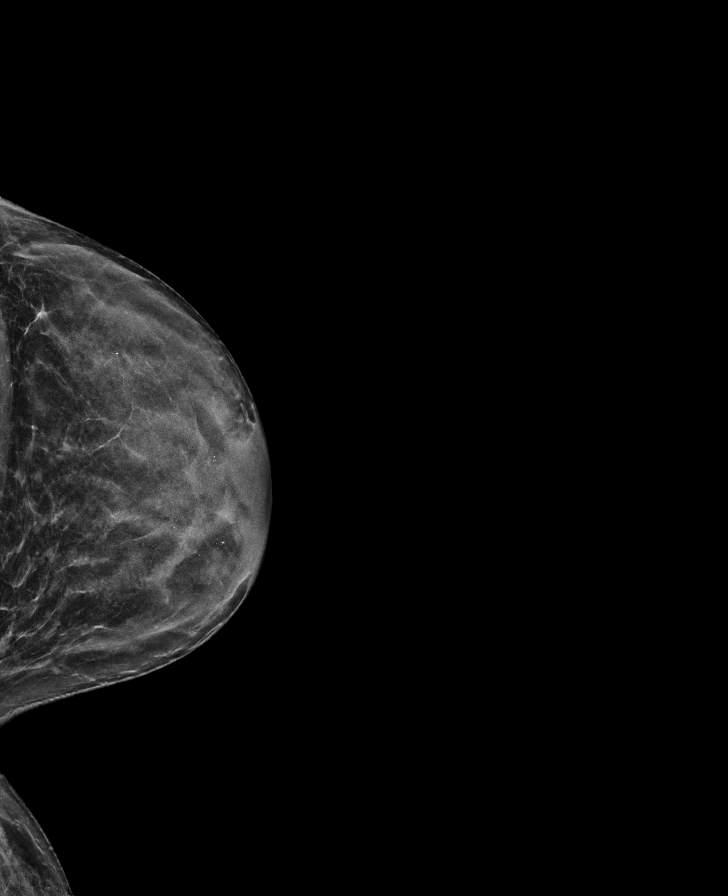

[R MLO synth-2D]
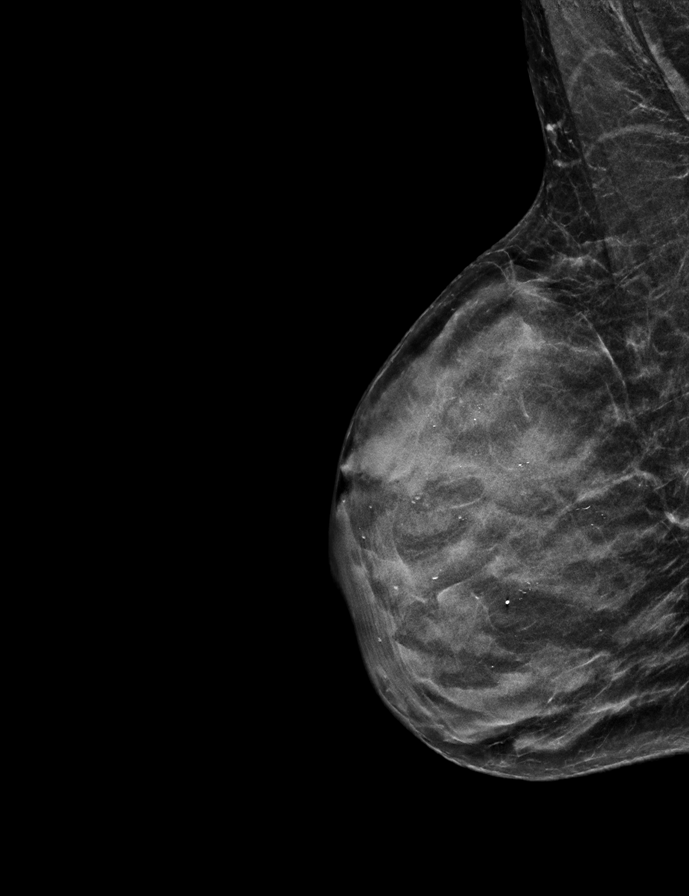

[R CC synth-2D]
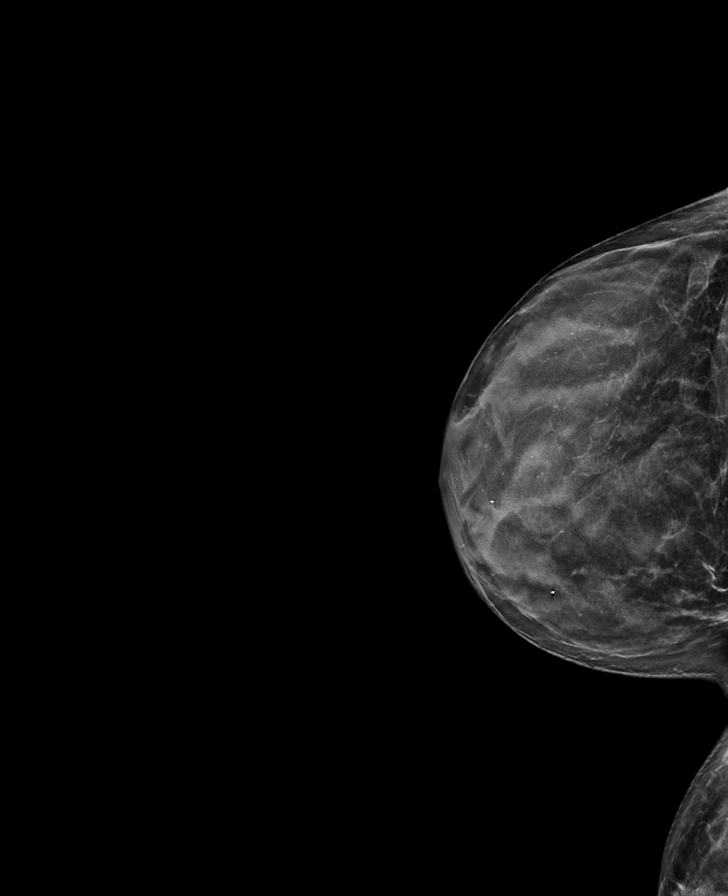

[R CC tomo · tomo slice 29/57.0]
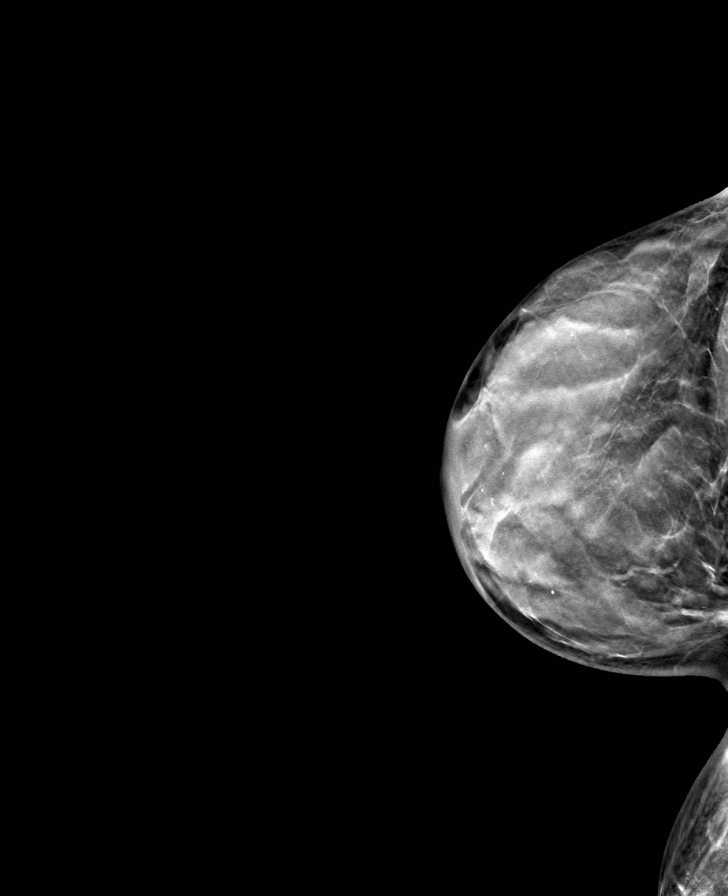

[L MLO tomo · tomo slice 21/41.0]
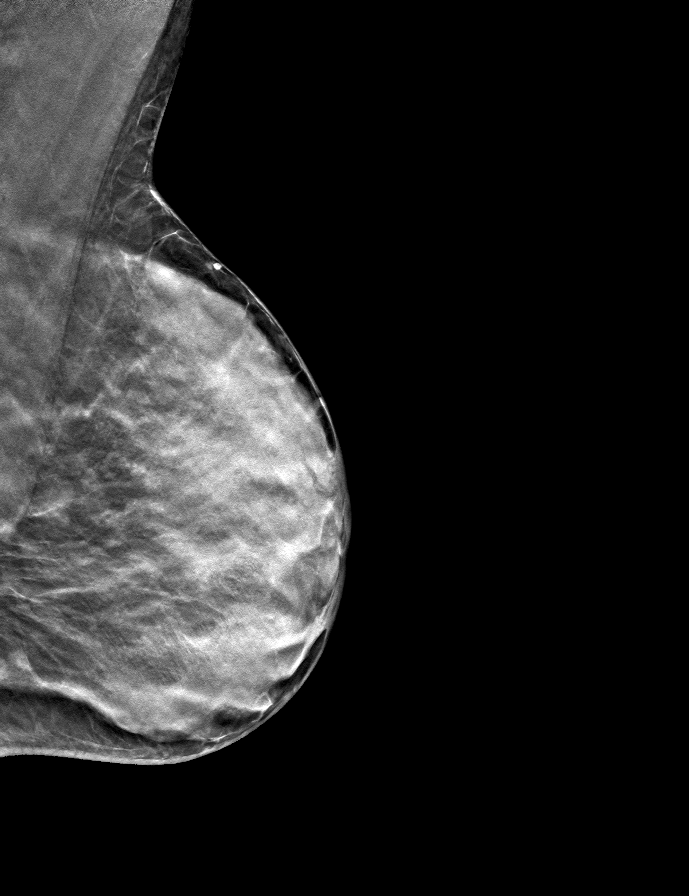

[R MLO tomo · tomo slice 22/43.0]
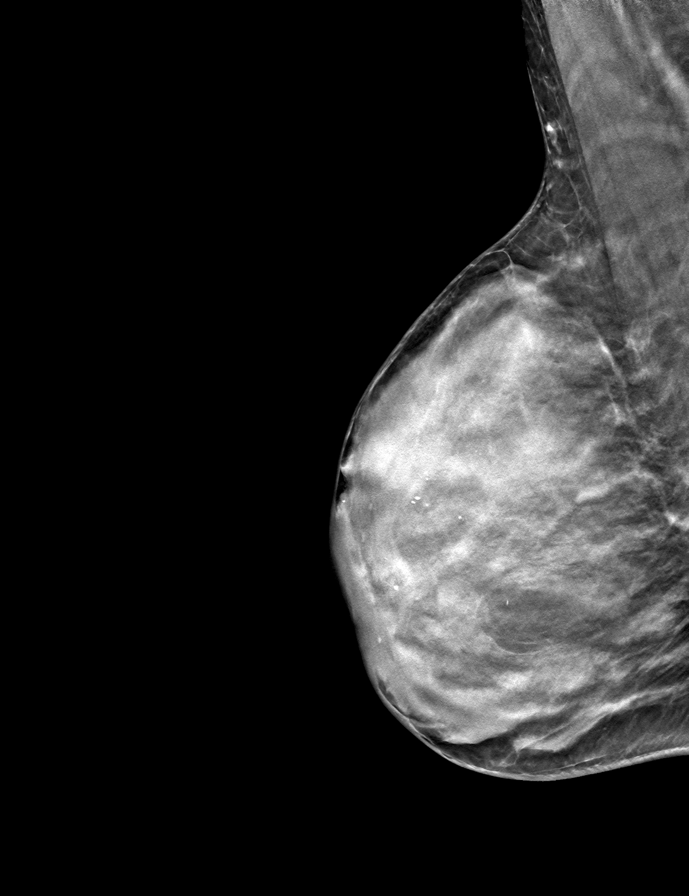

[L CC tomo · tomo slice 28/55.0]
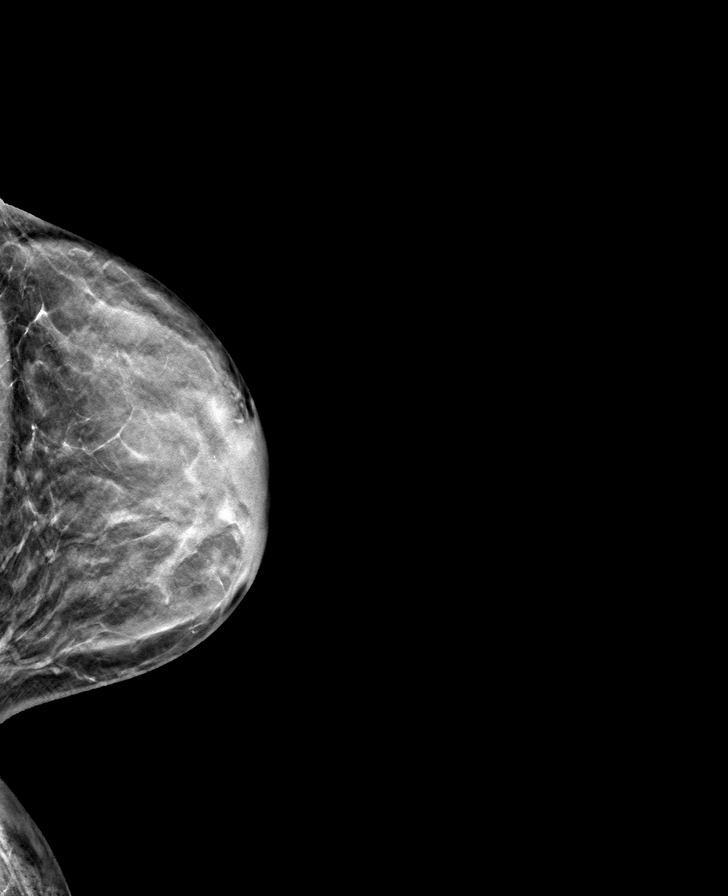

[8 of 24 positions shown; findings below may reference images not displayed]

ACR Breast Density Category d: The breast tissue is extremely dense,
which lowers the sensitivity of mammography.
FINDINGS: Full field CC and MLO views of both breasts were obtained.

RIGHT: The previously identified masses in the upper breast are
inconspicuous, obscured by the patient's dense fibroglandular
tissue. No new or suspicious findings elsewhere.

Targeted ultrasound is performed, again demonstrating the oval
circumscribed parallel hypoechoic mass at the 12 o'clock position 2
cm from the nipple measuring approximately 1.4 x 0.8 x 1.3 cm
(previously 1.5 x 0.3 x 1.4 cm on 05/15/2021 and 1.5 x 0.7 x 1.4 cm
on 09/18/2020), demonstrating mixed posterior characteristics and no
internal color Doppler flow.

The adjacent benign cyst at the 11:30 o'clock position 2 cm from the
nipple measures approximately 0.6 x 0.4 x 0.5 cm.

LEFT: No findings suspicious for malignancy.
IMPRESSION: 1. Stable likely benign 1.4 cm mass involving the upper RIGHT breast
at 12 o'clock 2 cm from the nipple, likely a fibroadenoma.
2. No mammographic evidence of malignancy involving the LEFT breast.

RECOMMENDATION:
1. RIGHT breast ultrasound in August 2022, in order to confirm 2
years of stability of the likely benign mass.
2. If the mass remains stable on that examination, the patient may
return to annual screening mammography in 1 year.

I have discussed the findings and recommendations with the patient.
If applicable, a reminder letter will be sent to the patient
regarding the next appointment.

BI-RADS CATEGORY  3: Probably benign.

## 2024-09-05 NOTE — Progress Notes (Deleted)
 ANNUAL GYNECOLOGICAL EXAM  SUBJECTIVE  HPI  Kimberly Carrillo is a 48 y.o.-year-old [id number] who presents for an annual gynecological exam today.  She denies pelvic pain, dyspareunia, abnormal vaginal bleeding or discharge, and UTI symptoms. ***  Medical/Surgical History Past Medical History:  Diagnosis Date   No known health problems    Past Surgical History:  Procedure Laterality Date   COLONOSCOPY WITH PROPOFOL  N/A 09/26/2022   Procedure: COLONOSCOPY WITH PROPOFOL ;  Surgeon: Unk Corinn Skiff, MD;  Location: [facility] ENDOSCOPY;  Service: Gastroenterology;  Laterality: N/A;   WISDOM TOOTH EXTRACTION      Social History Lives with ***. ***Feels safe there Work: Exercise: Substances: ***EtOH, tobacco, vape, and recreational drugs  Obstetric History OB History     Gravida  4   Para  4   Term  4   Preterm      AB      Living  4      SAB      IAB      Ectopic      Multiple      Live Births  4            GYN/Menstrual History No LMP recorded. {Regular/irregular menstrual period abdominal pain hpi [id number]} Last Pap: Contraception:  Prevention Dentist Eye exam Mammogram Colonoscopy Flu shot/vaccines  Current Medications No outpatient medications prior to visit.   No facility-administered medications prior to visit.        ROS Constitutional: Denied constitutional symptoms, night sweats, recent illness, fatigue, fever, insomnia and weight loss.  Eyes: Denied eye symptoms, eye pain, photophobia, vision change and visual disturbance.  Ears/Nose/Throat/Neck: Denied ear, nose, throat or neck symptoms, hearing loss, nasal discharge, sinus congestion and sore throat.  Cardiovascular: Denied cardiovascular symptoms, arrhythmia, chest pain/pressure, edema, exercise intolerance, orthopnea and palpitations.  Respiratory: Denied pulmonary symptoms, asthma, pleuritic pain, productive sputum, cough, dyspnea and wheezing.  Gastrointestinal: Denied  gastro-esophageal reflux, melena, nausea and vomiting.  Genitourinary:*** Denied genitourinary symptoms including symptomatic vaginal discharge, pelvic relaxation issues, and urinary complaints.  Musculoskeletal: Denied musculoskeletal symptoms, stiffness, swelling, muscle weakness and myalgia.  Dermatologic: Denied dermatology symptoms, rash and scar.  Neurologic: Denied neurology symptoms, dizziness, headache, neck pain and syncope.  Psychiatric: Denied psychiatric symptoms, anxiety and depression.  Endocrine: Denied endocrine symptoms including hot flashes and night sweats.    OBJECTIVE  There were no vitals taken for this visit.   Physical examination General NAD, Conversant  HEENT Atraumatic; Op clear with mmm.  Normo-cephalic. Pupils reactive. Anicteric sclerae  Thyroid/Neck Smooth without nodularity or enlargement. Normal ROM.  Neck Supple.  Skin No rashes, lesions or ulceration. Normal palpated skin turgor. No nodularity.  Breasts: No masses or discharge.  Symmetric.  No axillary adenopathy.  Lungs: Clear to auscultation.No rales or wheezes. Normal Respiratory effort, no retractions.  Heart: NSR.  No murmurs or rubs appreciated. No peripheral edema  Abdomen: Soft.  Non-tender.  No masses.  No HSM. No hernia  Extremities: Moves all appropriately.  Normal ROM for age. No lymphadenopathy.  Neuro: Oriented to PPT.  Normal mood. Normal affect.     Pelvic:   Vulva: Normal appearance.  No lesions.  Vagina: No lesions or abnormalities noted.  Support: Normal pelvic support.  Urethra No masses tenderness or scarring.  Meatus Normal size without lesions or prolapse.  Cervix: Normal appearance.  No lesions.  Anus: Normal exam.  No lesions.  Perineum: Normal exam.  No lesions.        Bimanual  Uterus: Normal size.  Non-tender.  Mobile.  AV.  Adnexae: No masses.  Non-tender to palpation.  Cul-de-sac: Negative for abnormality.    ASSESSMENT  1) Annual exam  PLAN 1) Physical  exam as noted. Discussed healthy lifestyle choices and preventive care. 2) 3) Return in one year for annual exam or as needed for concerns.   Melissa Swanson, CNM

## 2024-09-09 ENCOUNTER — Ambulatory Visit: Payer: Self-pay | Admitting: Obstetrics

## 2024-09-12 ENCOUNTER — Encounter: Payer: Self-pay | Admitting: Obstetrics

## 2024-09-12 ENCOUNTER — Ambulatory Visit (INDEPENDENT_AMBULATORY_CARE_PROVIDER_SITE_OTHER): Payer: Self-pay | Admitting: Obstetrics

## 2024-09-12 VITALS — BP 92/61 | HR 65 | Ht 63.0 in | Wt 111.0 lb

## 2024-09-12 DIAGNOSIS — Z0000 Encounter for general adult medical examination without abnormal findings: Secondary | ICD-10-CM

## 2024-09-12 DIAGNOSIS — Z1231 Encounter for screening mammogram for malignant neoplasm of breast: Secondary | ICD-10-CM

## 2024-09-12 DIAGNOSIS — Z01419 Encounter for gynecological examination (general) (routine) without abnormal findings: Secondary | ICD-10-CM | POA: Diagnosis not present

## 2024-09-12 NOTE — Progress Notes (Signed)
 ANNUAL GYNECOLOGICAL EXAM  SUBJECTIVE  HPI  Kimberly Carrillo is a 48 y.o.-year-old [id number] who presents for an annual gynecological exam today.  She denies pelvic pain, dyspareunia, abnormal vaginal bleeding or discharge, and UTI symptoms. She has no health concerns today.  Medical/Surgical History Past Medical History:  Diagnosis Date   No known health problems    Past Surgical History:  Procedure Laterality Date   COLONOSCOPY WITH PROPOFOL  N/A 09/26/2022   Procedure: COLONOSCOPY WITH PROPOFOL ;  Surgeon: Unk Corinn Skiff, MD;  Location: [facility];  Service: Gastroenterology;  Laterality: N/A;   WISDOM TOOTH EXTRACTION      Social History Lives with husband and son. Feels safe there Work: heritage manager Exercise: yes Substances: Occasional EtOH; denies tobacco, vape, and recreational drugs  Obstetric History OB History     Gravida  4   Para  4   Term  4   Preterm      AB      Living  4      SAB      IAB      Ectopic      Multiple      Live Births  4            GYN/Menstrual History  Regular monthly periods Last Pap: 09/10/22 NILM, negative HPV Contraception: vasectomy  Prevention Dentist: recent Eye exam: yes Mammogram: 09/24/22 - stable benign mass in right breast Colonoscopy: 2023 Flu shot/vaccines  Current Medications No outpatient medications prior to visit.   No facility-administered medications prior to visit.        ROS Constitutional: Denied constitutional symptoms, night sweats, recent illness, fatigue, fever, insomnia and weight loss.  Eyes: Denied eye symptoms, eye pain, photophobia, vision change and visual disturbance.  Ears/Nose/Throat/Neck: Denied ear, nose, throat or neck symptoms, hearing loss, nasal discharge, sinus congestion and sore throat.  Cardiovascular: Denied cardiovascular symptoms, arrhythmia, chest pain/pressure, edema, exercise intolerance, orthopnea and palpitations.  Respiratory: Denied  pulmonary symptoms, asthma, pleuritic pain, productive sputum, cough, dyspnea and wheezing.  Gastrointestinal: Denied gastro-esophageal reflux, melena, nausea and vomiting.  Genitourinary: Denied genitourinary symptoms including symptomatic vaginal discharge, pelvic relaxation issues, and urinary complaints.  Musculoskeletal: Denied musculoskeletal symptoms, stiffness, swelling, muscle weakness and myalgia.  Dermatologic: Denied dermatology symptoms, rash and scar.  Neurologic: Denied neurology symptoms, dizziness, headache, neck pain and syncope.  Psychiatric: +anxiety, feels she is managing it fine  Endocrine: Denied endocrine symptoms including hot flashes and night sweats.    OBJECTIVE  BP 92/61   Pulse 65   Ht 5' 3 (1.6 m)   Wt 111 lb (50.3 kg)   BMI 19.66 kg/m    Physical examination General NAD, Conversant  HEENT Atraumatic; Op clear with mmm.  Normo-cephalic. Pupils reactive. Anicteric sclerae  Thyroid/Neck Smooth without nodularity or enlargement. Normal ROM.  Neck Supple.  Skin No rashes, lesions or ulceration. Normal palpated skin turgor. No nodularity.  Breasts: No masses or discharge.  Symmetric.  No axillary adenopathy. Dense breast tissue  Lungs: Clear to auscultation.No rales or wheezes. Normal Respiratory effort, no retractions.  Heart: NSR.  No murmurs or rubs appreciated. No peripheral edema  Abdomen: Soft.  Non-tender.  No masses.  No HSM. No hernia  Extremities: Moves all appropriately.  Normal ROM for age. No lymphadenopathy.  Neuro: Oriented to PPT.  Normal mood. Normal affect.     Pelvic: Declined    ASSESSMENT  1) Annual exam  PLAN 1) Physical exam as noted. Discussed healthy lifestyle choices and  preventive care. Declines STI testing. Labs: A1C, BMP, CBC, lipid profile, TSH. Mammogram ordered   Return in one year for annual exam or as needed for concerns.   Onda Kattner, CNM

## 2024-09-13 LAB — LIPID PANEL
Chol/HDL Ratio: 2.5 ratio (ref 0.0–4.4)
Cholesterol, Total: 160 mg/dL (ref 100–199)
HDL: 64 mg/dL (ref >39)
LDL Chol Calc (NIH): 87 mg/dL (ref 0–99)
Triglycerides: 41 mg/dL (ref 0–149)
VLDL Cholesterol Cal: 9 mg/dL (ref 5–40)

## 2024-09-13 LAB — CBC
Hematocrit: 38.4 % (ref 34.0–46.6)
Hemoglobin: 12.8 g/dL (ref 11.1–15.9)
MCH: 30 pg (ref 26.6–33.0)
MCHC: 33.3 g/dL (ref 31.5–35.7)
MCV: 90 fL (ref 79–97)
Platelets: 268 x10E3/uL (ref 150–450)
RBC: 4.27 x10E6/uL (ref 3.77–5.28)
RDW: 12.4 % (ref 11.7–15.4)
WBC: 6.7 x10E3/uL (ref 3.4–10.8)

## 2024-09-13 LAB — BASIC METABOLIC PANEL WITH GFR
BUN/Creatinine Ratio: 11 (ref 9–23)
BUN: 10 mg/dL (ref 6–24)
CO2: 22 mmol/L (ref 20–29)
Calcium: 9.5 mg/dL (ref 8.7–10.2)
Chloride: 102 mmol/L (ref 96–106)
Creatinine, Ser: 0.87 mg/dL (ref 0.57–1.00)
Glucose: 75 mg/dL (ref 70–99)
Potassium: 4 mmol/L (ref 3.5–5.2)
Sodium: 138 mmol/L (ref 134–144)
eGFR: 82 mL/min/1.73 (ref >59)

## 2024-09-13 LAB — TSH: TSH: 1.32 u[IU]/mL (ref 0.450–4.500)

## 2024-09-13 LAB — HEMOGLOBIN A1C
Est. average glucose Bld gHb Est-mCnc: 103 mg/dL
Hgb A1c MFr Bld: 5.2 % (ref 4.8–5.6)

## 2024-09-15 ENCOUNTER — Other Ambulatory Visit: Payer: Self-pay | Admitting: Obstetrics

## 2024-09-15 ENCOUNTER — Ambulatory Visit: Payer: Self-pay | Admitting: Obstetrics

## 2024-09-15 DIAGNOSIS — Z1231 Encounter for screening mammogram for malignant neoplasm of breast: Secondary | ICD-10-CM

## 2024-09-15 NOTE — Progress Notes (Signed)
 Mammogram order re-entered [id number]
